# Patient Record
Sex: Male | Born: 2005 | Race: Black or African American | Hispanic: No | Marital: Single | Smoking: Former smoker
Health system: Southern US, Community
[De-identification: ages and names within clinical notes are randomized; demographics above are authoritative.]

## PROBLEM LIST (undated history)

## (undated) ENCOUNTER — Ambulatory Visit: Admission: EM | Payer: MEDICAID | Source: Home / Self Care

## (undated) DIAGNOSIS — F909 Attention-deficit hyperactivity disorder, unspecified type: Secondary | ICD-10-CM

## (undated) DIAGNOSIS — F4310 Post-traumatic stress disorder, unspecified: Secondary | ICD-10-CM

## (undated) DIAGNOSIS — F988 Other specified behavioral and emotional disorders with onset usually occurring in childhood and adolescence: Secondary | ICD-10-CM

## (undated) DIAGNOSIS — F3481 Disruptive mood dysregulation disorder: Secondary | ICD-10-CM

## (undated) DIAGNOSIS — F913 Oppositional defiant disorder: Secondary | ICD-10-CM

## (undated) DIAGNOSIS — F639 Impulse disorder, unspecified: Secondary | ICD-10-CM

---
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
## 2023-04-03 ENCOUNTER — Other Ambulatory Visit: Payer: Self-pay

## 2023-04-03 ENCOUNTER — Encounter (HOSPITAL_BASED_OUTPATIENT_CLINIC_OR_DEPARTMENT_OTHER): Payer: Self-pay | Admitting: Emergency Medicine

## 2023-04-03 ENCOUNTER — Emergency Department (HOSPITAL_BASED_OUTPATIENT_CLINIC_OR_DEPARTMENT_OTHER)
Admission: EM | Admit: 2023-04-03 | Discharge: 2023-04-03 | Disposition: A | Discharge status: Home / Self Care | Payer: MEDICAID | Attending: Emergency Medicine | Admitting: Emergency Medicine

## 2023-04-03 DIAGNOSIS — N4889 Other specified disorders of penis: Secondary | ICD-10-CM | POA: Diagnosis present

## 2023-04-03 DIAGNOSIS — N341 Nonspecific urethritis: Secondary | ICD-10-CM | POA: Diagnosis not present

## 2023-04-03 DIAGNOSIS — N342 Other urethritis: Secondary | ICD-10-CM

## 2023-04-03 LAB — URINALYSIS, ROUTINE W REFLEX MICROSCOPIC
Bilirubin Urine: NEGATIVE
Glucose, UA: NEGATIVE mg/dL
Hgb urine dipstick: NEGATIVE
Ketones, ur: NEGATIVE mg/dL
Nitrite: NEGATIVE
Protein, ur: NEGATIVE mg/dL
Specific Gravity, Urine: 1.025 (ref 1.005–1.030)
pH: 6 (ref 5.0–8.0)

## 2023-04-03 LAB — URINALYSIS, MICROSCOPIC (REFLEX)
Bacteria, UA: NONE SEEN
Squamous Epithelial / HPF: NONE SEEN /HPF (ref 0–5)

## 2023-04-03 MED ORDER — DOXYCYCLINE HYCLATE 100 MG PO TABS
100.0000 mg | ORAL_TABLET | Freq: Once | ORAL | Status: AC
  Administered 2023-04-03: 100 mg via ORAL
  Filled 2023-04-03: qty 1

## 2023-04-03 MED ORDER — CEFTRIAXONE SODIUM 500 MG IJ SOLR
500.0000 mg | Freq: Once | INTRAMUSCULAR | Status: AC
  Administered 2023-04-03: 500 mg via INTRAMUSCULAR
  Filled 2023-04-03: qty 500

## 2023-04-03 MED ORDER — DOXYCYCLINE HYCLATE 100 MG PO CAPS: 100.0000 mg | ORAL_CAPSULE | Freq: Two times a day (BID) | ORAL | 0 refills | Status: DC

## 2023-04-03 NOTE — ED Triage Notes (Signed)
Pt concerned for "crabs in his penis", pt reports he can feel it moving when he grabs the back of his penis, endorses itching and discharge x 3 days, pt states he has had sexual intercourse with approximately 15 women and has used condoms once

## 2023-04-03 NOTE — ED Provider Notes (Signed)
[place] EMERGENCY DEPARTMENT AT [facility]  Provider Note  CSN: [id number] Arrival date & time: 04/03/23 5284  History Chief Complaint  Patient presents with   Foreign Body    Derrick [name]. is a 17 y.o. male with history of prior psychiatric problems, but no medical problems presents to the ED for one day of penile discomfort. Reports he felt like there was a 'crab' in his penis because when he was younger his sister told him not to use public bathrooms because 'that's how you get crabs' and he recently used the bathroom at the [employer] where he works. He report some discomfort with urination. No itching or sores.    Home Medications Prior to Admission medications   Medication Sig Start Date End Date Taking? Authorizing Provider  doxycycline (VIBRAMYCIN) 100 MG capsule Take 1 capsule (100 mg total) by mouth 2 (two) times daily. 04/03/23  Yes Pollyann Savoy, MD     Allergies    Zyprexa [olanzapine], Blueberry [vaccinium angustifolium], and Fish allergy   Review of Systems   Review of Systems Please see HPI for pertinent positives and negatives  Physical Exam BP (!) 152/96 (BP Location: Right Arm)   Pulse 65   Temp 98.1 F (36.7 C) (Oral)   Resp 19   Ht 5\' 8"  (1.727 m)   Wt 59 kg   SpO2 100%   BMI 19.77 kg/m   Physical Exam Vitals and nursing note reviewed. Exam conducted with a chaperone present.  HENT:     Head: Normocephalic.     Nose: Nose normal.  Eyes:     Extraocular Movements: Extraocular movements intact.  Pulmonary:     Effort: Pulmonary effort is normal.  Genitourinary:    Penis: Normal.      Comments: No discharge or signs of pubic lice; patient now unable to locate the spot where he felt the 'crab' earlier tonight.  Musculoskeletal:        General: Normal range of motion.     Cervical back: Neck supple.  Skin:    Findings: No rash (on exposed skin).  Neurological:     Mental Status: He is alert and oriented to person,  place, and time.  Psychiatric:        Mood and Affect: Mood normal.     ED Results / Procedures / Treatments   EKG None  Procedures Procedures  Medications Ordered in the ED Medications  cefTRIAXone (ROCEPHIN) injection 500 mg (has no administration in time range)  doxycycline (VIBRA-TABS) tablet 100 mg (has no administration in time range)    Initial Impression and Plan  Patient here with some vague penile discomfort. Has a normal exam, reassured no signs of a crab in his penis. Offered STI testing which he agrees to.   ED Course   Clinical Course as of 04/03/23 0327  Tue Apr 03, 2023  0323 UA without definite signs of infection, GC/CT pending, will treat empirically for urethritis. Health Dept follow up.  [CS]    Clinical Course User Index [CS] Pollyann Savoy, MD     MDM Rules/Calculators/A&P Medical Decision Making Problems Addressed: Urethritis: acute illness or injury  Amount and/or Complexity of Data Reviewed Labs: ordered. Decision-making details documented in ED Course.  Risk Prescription drug management.     Final Clinical Impression(s) / ED Diagnoses Final diagnoses:  Urethritis    Rx / DC Orders ED Discharge Orders          Ordered  doxycycline (VIBRAMYCIN) 100 MG capsule  2 times daily        04/03/23 0327             Pollyann Savoy, MD 04/03/23 [phone]

## 2023-04-03 NOTE — ED Notes (Signed)
Refused d/c vitals.

## 2023-04-04 LAB — GC/CHLAMYDIA PROBE AMP (~~LOC~~) NOT AT ARMC
Chlamydia: NEGATIVE
Comment: NEGATIVE
Comment: NORMAL
Neisseria Gonorrhea: NEGATIVE

## 2023-09-10 ENCOUNTER — Ambulatory Visit
Admission: EM | Admit: 2023-09-10 | Discharge: 2023-09-10 | Disposition: A | Discharge status: Home / Self Care | Payer: MEDICAID | Attending: Physician Assistant | Admitting: Physician Assistant

## 2023-09-10 VITALS — BP 100/66 | HR 74 | Temp 100.6°F | Resp 16 | Ht 68.0 in | Wt 130.1 lb

## 2023-09-10 DIAGNOSIS — J069 Acute upper respiratory infection, unspecified: Secondary | ICD-10-CM | POA: Insufficient documentation

## 2023-09-10 DIAGNOSIS — H65191 Other acute nonsuppurative otitis media, right ear: Secondary | ICD-10-CM | POA: Diagnosis present

## 2023-09-10 DIAGNOSIS — J029 Acute pharyngitis, unspecified: Secondary | ICD-10-CM | POA: Diagnosis not present

## 2023-09-10 LAB — POCT INFLUENZA A/B
Influenza A, POC: NEGATIVE
Influenza B, POC: NEGATIVE

## 2023-09-10 LAB — POCT RAPID STREP A (OFFICE): Rapid Strep A Screen: NEGATIVE

## 2023-09-10 MED ORDER — AMOXICILLIN 500 MG PO CAPS: 500.0000 mg | ORAL_CAPSULE | Freq: Three times a day (TID) | ORAL | 0 refills | Status: AC

## 2023-09-10 MED ORDER — ACETAMINOPHEN 325 MG PO TABS
650.0000 mg | ORAL_TABLET | Freq: Once | ORAL | Status: AC
  Administered 2023-09-10: 650 mg via ORAL

## 2023-09-10 NOTE — ED Triage Notes (Signed)
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 "The sore throat remains and my body still aches". No new/unexplained rash. No fever "known". Now today "Cough with blowing my nose a lot". "I can't seem to taste well".

## 2023-09-13 ENCOUNTER — Encounter (HOSPITAL_COMMUNITY): Payer: Self-pay | Admitting: Emergency Medicine

## 2023-09-13 ENCOUNTER — Emergency Department (HOSPITAL_COMMUNITY)
Admission: EM | Admit: 2023-09-13 | Discharge: 2023-09-14 | Discharge status: Against Medical Advice | Payer: MEDICAID | Attending: Emergency Medicine | Admitting: Emergency Medicine

## 2023-09-13 ENCOUNTER — Other Ambulatory Visit: Payer: Self-pay

## 2023-09-13 DIAGNOSIS — R059 Cough, unspecified: Secondary | ICD-10-CM | POA: Diagnosis present

## 2023-09-13 DIAGNOSIS — Z5321 Procedure and treatment not carried out due to patient leaving prior to being seen by health care provider: Secondary | ICD-10-CM | POA: Insufficient documentation

## 2023-09-13 NOTE — ED Triage Notes (Signed)
 Patient c/o cough x 3 days. Patient stated he feels better but needs a Dr's. Note for work to be off until weekend.

## 2023-09-15 LAB — SARS CORONAVIRUS 2 (TAT 6-24 HRS): SARS Coronavirus 2: NEGATIVE

## 2023-09-17 LAB — CULTURE, GROUP A STREP (THRC)

## 2024-01-22 ENCOUNTER — Emergency Department (HOSPITAL_COMMUNITY): Payer: MEDICAID

## 2024-01-22 ENCOUNTER — Other Ambulatory Visit: Payer: Self-pay

## 2024-01-22 ENCOUNTER — Emergency Department (HOSPITAL_COMMUNITY)
Admission: EM | Admit: 2024-01-22 | Discharge: 2024-01-23 | Disposition: A | Discharge status: Home / Self Care | Payer: MEDICAID | Attending: Emergency Medicine | Admitting: Emergency Medicine

## 2024-01-22 DIAGNOSIS — S61411A Laceration without foreign body of right hand, initial encounter: Secondary | ICD-10-CM

## 2024-01-22 DIAGNOSIS — Z23 Encounter for immunization: Secondary | ICD-10-CM | POA: Insufficient documentation

## 2024-01-22 DIAGNOSIS — S61011A Laceration without foreign body of right thumb without damage to nail, initial encounter: Secondary | ICD-10-CM | POA: Insufficient documentation

## 2024-01-22 DIAGNOSIS — Y990 Civilian activity done for income or pay: Secondary | ICD-10-CM | POA: Insufficient documentation

## 2024-01-22 DIAGNOSIS — W268XXA Contact with other sharp object(s), not elsewhere classified, initial encounter: Secondary | ICD-10-CM | POA: Diagnosis not present

## 2024-01-22 DIAGNOSIS — S6991XA Unspecified injury of right wrist, hand and finger(s), initial encounter: Secondary | ICD-10-CM | POA: Diagnosis present

## 2024-01-22 NOTE — ED Triage Notes (Signed)
 Pt presents with laceration to right thumb. Bleeding controlled. Sts he cut it on a metal sheet at work. Unknown last tetanus.

## 2024-01-23 MED ORDER — BACITRACIN ZINC 500 UNIT/GM EX OINT
TOPICAL_OINTMENT | Freq: Two times a day (BID) | CUTANEOUS | Status: DC
  Administered 2024-01-23: 1 via TOPICAL
  Filled 2024-01-23: qty 0.9

## 2024-01-23 MED ORDER — LIDOCAINE HCL (PF) 1 % IJ SOLN
5.0000 mL | Freq: Once | INTRAMUSCULAR | Status: AC
  Administered 2024-01-23: 5 mL
  Filled 2024-01-23: qty 30

## 2024-01-23 MED ORDER — TETANUS-DIPHTH-ACELL PERTUSSIS 5-2.5-18.5 LF-MCG/0.5 IM SUSY
0.5000 mL | PREFILLED_SYRINGE | Freq: Once | INTRAMUSCULAR | Status: AC
  Administered 2024-01-23: 0.5 mL via INTRAMUSCULAR
  Filled 2024-01-23: qty 0.5

## 2024-01-23 NOTE — ED Provider Notes (Signed)
 [place] EMERGENCY DEPARTMENT AT [facility] Provider Note   CSN: [id number] Arrival date & time: 01/22/24  2045     History  Chief Complaint  Patient presents with   Finger Injury    Derrick Riessen. is a 18 y.o. male.  The history is provided by the patient.   Derrick Lupold. is a 18 y.o. male who presents to the Emergency Department complaining of hand injury.  He presents to the emergency department accompanied by his grandmother for evaluation of injury to his right hand.  He states that he was working with sheet-metal when he sustained a cut to his right first digit.  He is right-hand dominant.  Tetanus is unknown.  He has no known medical problems and takes no routine medications.    Home Medications Prior to Admission medications   Medication Sig Start Date End Date Taking? Authorizing Provider  amoxicillin  (AMOXIL ) 500 MG capsule Take 1 capsule (500 mg total) by mouth 3 (three) times daily. 09/10/23   Vernestine Gondola, PA-C  UNABLE TO FIND Med Name: Multi-symptom cold medicines.    [provider]      Allergies    Zyprexa  [olanzapine ], Blueberry [vaccinium angustifolium], and Fish allergy    Review of Systems   Review of Systems  All other systems reviewed and are negative.   Physical Exam Updated Vital Signs BP 120/70 (BP Location: Left Arm)   Pulse 80   Temp 98.3 F (36.8 C) (Oral)   Resp 17   SpO2 99%  Physical Exam Vitals and nursing note reviewed.  Constitutional:      Appearance: He is well-developed.  HENT:     Head: Normocephalic and atraumatic.  Cardiovascular:     Rate and Rhythm: Normal rate and regular rhythm.  Pulmonary:     Effort: Pulmonary effort is normal. No respiratory distress.  Musculoskeletal:     Comments: 2+ right radial pulse.  There is a 1-1/2 cm laceration at the base of the first digit.  He is able to fully range the digit against resistance.  There is no significant tenderness to the area.  Sensation  to light touch intact throughout the digit.  Skin:    General: Skin is warm and dry.  Neurological:     Mental Status: He is alert and oriented to person, place, and time.  Psychiatric:        Behavior: Behavior normal.     ED Results / Procedures / Treatments   Labs (all labs ordered are listed, but only abnormal results are displayed) Labs Reviewed - No data to display  EKG None  Radiology DG Hand Complete Right Result Date: 01/22/2024 CLINICAL DATA:  Right hand laceration. EXAM: RIGHT HAND - COMPLETE 3+ VIEW COMPARISON:  November 05, 2013 FINDINGS: There is no evidence of fracture or dislocation. There is no evidence of arthropathy or other focal bone abnormality. A small superficial soft tissue defect is seen along the base of the right thumb. IMPRESSION: Superficial soft tissue defect along the base of the right thumb. Electronically Signed   By: Virgle Grime M.D.   On: 01/22/2024 23:35    Procedures .Laceration Repair  Date/Time: 01/23/2024 1:06 AM  Performed by: Kelsey Patricia, MD Authorized by: Kelsey Patricia, MD   Consent:    Consent obtained:  Verbal   Consent given by:  Patient   Risks discussed:  Infection, pain, poor cosmetic result and poor wound healing Universal protocol:    Patient identity confirmed:  Verbally with patient Anesthesia:    Anesthesia method:  Local infiltration   Local anesthetic:  Lidocaine 1% w/o epi Laceration details:    Location:  Finger   Finger location:  R thumb   Length (cm):  1.5 Pre-procedure details:    Preparation:  Patient was prepped and draped in usual sterile fashion Exploration:    Hemostasis achieved with:  Direct pressure   Imaging outcome: foreign body not noted     Wound exploration: wound explored through full range of motion   Treatment:    Area cleansed with:  Povidone-iodine and saline   Amount of cleaning:  Standard   Irrigation solution:  Sterile saline   Debridement:  None Skin repair:    Repair  method:  Sutures   Suture size:  4-0   Suture material:  Prolene   Suture technique:  Simple interrupted   Number of sutures:  3 Repair type:    Repair type:  Simple Post-procedure details:    Dressing:  Antibiotic ointment   Procedure completion:  Tolerated well, no immediate complications     Medications Ordered in ED Medications  bacitracin ointment (1 Application Topical Given 01/23/24 0133)  lidocaine (PF) (XYLOCAINE) 1 % injection 5 mL (5 mLs Infiltration Given 01/23/24 0037)  Tdap (BOOSTRIX) injection 0.5 mL (0.5 mLs Intramuscular Given 01/23/24 0036)    ED Course/ Medical Decision Making/ A&P                                 Medical Decision Making Risk OTC drugs. Prescription drug management.   Patient here for evaluation of right thumb injury, cut on sheet-metal earlier today.  Plain film is negative for fracture or foreign body.  No evidence of tendon or joint involvement on examination.  No evidence of foreign body at bedside evaluation.  Wound cleansed and repaired per note.  Discussed local wound care, outpatient follow-up and return precautions.        Final Clinical Impression(s) / ED Diagnoses Final diagnoses:  Laceration of right hand without foreign body, initial encounter    Rx / DC Orders ED Discharge Orders     None         Kelsey Patricia, MD 01/23/24 0200

## 2024-04-11 ENCOUNTER — Ambulatory Visit: Payer: MEDICAID | Admitting: Podiatry

## 2024-10-17 ENCOUNTER — Ambulatory Visit: Payer: MEDICAID | Admitting: Podiatry

## 2024-10-17 DIAGNOSIS — Z79899 Other long term (current) drug therapy: Secondary | ICD-10-CM

## 2024-10-17 NOTE — Progress Notes (Unsigned)
 Toenaisxl 10 lft lamisil
